# Patient Record
Sex: Female | Born: 1995 | Race: Asian | Hispanic: No | Marital: Single | Smoking: Current every day smoker
Health system: Southern US, Community
[De-identification: ages and names within clinical notes are randomized; demographics above are authoritative.]

---

## 2015-05-24 ENCOUNTER — Emergency Department (HOSPITAL_COMMUNITY): Payer: PRIVATE HEALTH INSURANCE

## 2015-05-24 ENCOUNTER — Emergency Department (HOSPITAL_COMMUNITY)
Admission: EM | Admit: 2015-05-24 | Discharge: 2015-05-26 | Disposition: A | Discharge status: Home / Self Care | Payer: PRIVATE HEALTH INSURANCE | Attending: Emergency Medicine | Admitting: Emergency Medicine

## 2015-05-24 ENCOUNTER — Encounter (HOSPITAL_COMMUNITY): Payer: Self-pay

## 2015-05-24 DIAGNOSIS — Y9289 Other specified places as the place of occurrence of the external cause: Secondary | ICD-10-CM | POA: Insufficient documentation

## 2015-05-24 DIAGNOSIS — S90812A Abrasion, left foot, initial encounter: Secondary | ICD-10-CM | POA: Insufficient documentation

## 2015-05-24 DIAGNOSIS — Z3202 Encounter for pregnancy test, result negative: Secondary | ICD-10-CM | POA: Diagnosis not present

## 2015-05-24 DIAGNOSIS — X58XXXA Exposure to other specified factors, initial encounter: Secondary | ICD-10-CM | POA: Diagnosis not present

## 2015-05-24 DIAGNOSIS — Y9389 Activity, other specified: Secondary | ICD-10-CM | POA: Diagnosis not present

## 2015-05-24 DIAGNOSIS — F29 Unspecified psychosis not due to a substance or known physiological condition: Secondary | ICD-10-CM | POA: Diagnosis not present

## 2015-05-24 DIAGNOSIS — S80212A Abrasion, left knee, initial encounter: Secondary | ICD-10-CM | POA: Insufficient documentation

## 2015-05-24 DIAGNOSIS — S80211A Abrasion, right knee, initial encounter: Secondary | ICD-10-CM | POA: Insufficient documentation

## 2015-05-24 DIAGNOSIS — F0630 Mood disorder due to known physiological condition, unspecified: Secondary | ICD-10-CM

## 2015-05-24 DIAGNOSIS — Y998 Other external cause status: Secondary | ICD-10-CM | POA: Diagnosis not present

## 2015-05-24 DIAGNOSIS — F1994 Other psychoactive substance use, unspecified with psychoactive substance-induced mood disorder: Secondary | ICD-10-CM

## 2015-05-24 DIAGNOSIS — Z720 Tobacco use: Secondary | ICD-10-CM | POA: Insufficient documentation

## 2015-05-24 DIAGNOSIS — R000 Tachycardia, unspecified: Secondary | ICD-10-CM | POA: Insufficient documentation

## 2015-05-24 DIAGNOSIS — Z008 Encounter for other general examination: Secondary | ICD-10-CM | POA: Diagnosis present

## 2015-05-24 DIAGNOSIS — S90811A Abrasion, right foot, initial encounter: Secondary | ICD-10-CM | POA: Insufficient documentation

## 2015-05-24 LAB — CBC WITH DIFFERENTIAL/PLATELET
BASOS PCT: 0 % (ref 0–1)
Basophils Absolute: 0 10*3/uL (ref 0.0–0.1)
EOS PCT: 2 % (ref 0–5)
Eosinophils Absolute: 0.3 10*3/uL (ref 0.0–0.7)
HEMATOCRIT: 41.8 % (ref 36.0–46.0)
HEMOGLOBIN: 14 g/dL (ref 12.0–15.0)
LYMPHS PCT: 48 % — AB (ref 12–46)
Lymphs Abs: 6.9 10*3/uL — ABNORMAL HIGH (ref 0.7–4.0)
MCH: 31.2 pg (ref 26.0–34.0)
MCHC: 33.5 g/dL (ref 30.0–36.0)
MCV: 93.1 fL (ref 78.0–100.0)
MONOS PCT: 5 % (ref 3–12)
Monocytes Absolute: 0.7 10*3/uL (ref 0.1–1.0)
NEUTROS ABS: 6.4 10*3/uL (ref 1.7–7.7)
NEUTROS PCT: 45 % (ref 43–77)
Platelets: 246 10*3/uL (ref 150–400)
RBC: 4.49 MIL/uL (ref 3.87–5.11)
RDW: 11.9 % (ref 11.5–15.5)
WBC: 14.3 10*3/uL — ABNORMAL HIGH (ref 4.0–10.5)

## 2015-05-24 LAB — BASIC METABOLIC PANEL
ANION GAP: 23 — AB (ref 5–15)
ANION GAP: 8 (ref 5–15)
BUN: 7 mg/dL (ref 6–20)
BUN: 5 mg/dL — ABNORMAL LOW (ref 6–20)
CALCIUM: 8.9 mg/dL (ref 8.9–10.3)
CALCIUM: 8.5 mg/dL — AB (ref 8.9–10.3)
CO2: 9 mmol/L — ABNORMAL LOW (ref 22–32)
CO2: 22 mmol/L (ref 22–32)
Chloride: 103 mmol/L (ref 101–111)
Chloride: 106 mmol/L (ref 101–111)
Creatinine, Ser: 0.77 mg/dL (ref 0.44–1.00)
Creatinine, Ser: 0.62 mg/dL (ref 0.44–1.00)
GFR calc non Af Amer: >60 mL/min (ref >60)
GLUCOSE: 141 mg/dL — AB (ref 65–99)
GLUCOSE: 100 mg/dL — AB (ref 65–99)
POTASSIUM: 3 mmol/L — AB (ref 3.5–5.1)
POTASSIUM: 3.7 mmol/L (ref 3.5–5.1)
Sodium: 135 mmol/L (ref 135–145)
Sodium: 136 mmol/L (ref 135–145)

## 2015-05-24 LAB — ETHANOL: Alcohol, Ethyl (B): 49 mg/dL — ABNORMAL HIGH (ref <5)

## 2015-05-24 LAB — RAPID URINE DRUG SCREEN, HOSP PERFORMED
Amphetamines: NOT DETECTED
Barbiturates: NOT DETECTED
Benzodiazepines: NOT DETECTED
COCAINE: NOT DETECTED
OPIATES: NOT DETECTED
TETRAHYDROCANNABINOL: NOT DETECTED

## 2015-05-24 LAB — MONONUCLEOSIS SCREEN: Mono Screen: NEGATIVE

## 2015-05-24 LAB — ACETAMINOPHEN LEVEL

## 2015-05-24 LAB — I-STAT BETA HCG BLOOD, ED (MC, WL, AP ONLY)

## 2015-05-24 LAB — SALICYLATE LEVEL: Salicylate Lvl: <4 mg/dL (ref 2.8–30.0)

## 2015-05-24 LAB — RAPID STREP SCREEN (MED CTR MEBANE ONLY): STREPTOCOCCUS, GROUP A SCREEN (DIRECT): NEGATIVE

## 2015-05-24 MED ORDER — IBUPROFEN 200 MG PO TABS: 600.0000 mg | ORAL_TABLET | Freq: Three times a day (TID) | ORAL | Status: DC | PRN

## 2015-05-24 MED ORDER — LORAZEPAM 2 MG/ML IJ SOLN
1.0000 mg | Freq: Once | INTRAMUSCULAR | Status: AC
  Administered 2015-05-24: 1 mg via INTRAVENOUS
  Filled 2015-05-24: qty 1

## 2015-05-24 MED ORDER — ONDANSETRON HCL 4 MG PO TABS: 4.0000 mg | ORAL_TABLET | Freq: Three times a day (TID) | ORAL | Status: DC | PRN

## 2015-05-24 MED ORDER — ACETAMINOPHEN 325 MG PO TABS: 650.0000 mg | ORAL_TABLET | ORAL | Status: DC | PRN

## 2015-05-24 MED ORDER — SODIUM CHLORIDE 0.9 % IV BOLUS (SEPSIS)
1000.0000 mL | Freq: Once | INTRAVENOUS | Status: AC
Start: 2015-05-24 — End: 2015-05-24
  Administered 2015-05-24: 1000 mL via INTRAVENOUS

## 2015-05-24 NOTE — ED Notes (Signed)
Pt jumped out of car - had been drinking and "smoking something" this evening. Friends took away phone. A&O x1. Pt paranoid/anxious and believes everyone is going to hurt her. [employer] at bedside.

## 2015-05-24 NOTE — ED Notes (Signed)
Pt requesting water - given cup of water and refusing to drink, stating "you poisoned the water." Pt given sealed bottle of water and still refusing to drink, stating "you poisoned me - you are trying to kill me." Explained the staff is not trying to harm pt.

## 2015-05-24 NOTE — ED Provider Notes (Signed)
CSN: [id number]     Arrival date & time 05/24/15  0406 History   First MD Initiated Contact with Patient 05/24/15 0430     Chief Complaint  Patient presents with  . Drug Problem     (Consider location/radiation/quality/duration/timing/severity/associated sxs/prior Treatment) HPI 19 year old Asian female presents to the emergency department.  Altered, scared, paranoid.  Patient reports that she has been drinking beer and smoked a brown cigarettes about 2 hours ago.  She reports that she believes that had marijuana.  She is unsure if there were other substances.  Patient reports that she feels like she is going to die, and wishes to call her mother, father and grandparents to tell them she's sorry.  Patient reports that she has a "coughing problem".  She reports that she has many medical problems, but cannot relate them to me.  She denies any medications or allergies. No past medical history on file. No past surgical history on file. No family history on file. Social History  Substance Use Topics  . Smoking status: Current Every Day Smoker  . Smokeless tobacco: Not on file  . Alcohol Use: Yes   OB History    No data available     Review of Systems  Level V caveat, altered mental status  Allergies  Review of patient's allergies indicates not on file.  Home Medications   Prior to Admission medications   Not on File   BP 125/64 mmHg  Pulse 135  Temp(Src) 98.1 F (36.7 C) (Oral)  Resp 22  SpO2 100% Physical Exam  Constitutional: She appears well-developed and well-nourished. She appears distressed.  HENT:  Head: Normocephalic and atraumatic.  Right Ear: External ear normal.  Left Ear: External ear normal.  Nose: Nose normal.  Mouth/Throat: Oropharynx is clear and moist.  Eyes: Conjunctivae and EOM are normal. Pupils are equal, round, and reactive to light.  Neck: Normal range of motion. Neck supple. No JVD present. No tracheal deviation present. No thyromegaly present.   Cardiovascular: Regular rhythm, normal heart sounds and intact distal pulses.  Exam reveals no gallop and no friction rub.   No murmur heard. tachycardia  Pulmonary/Chest: Effort normal and breath sounds normal. No stridor. No respiratory distress. She has no wheezes. She has no rales. She exhibits no tenderness.  Abdominal: Soft. Bowel sounds are normal. She exhibits no distension and no mass. There is no tenderness. There is no rebound and no guarding.  Musculoskeletal: Normal range of motion. She exhibits no edema or tenderness.  Abrasions to knees and feet  Lymphadenopathy:    She has no cervical adenopathy.  Neurological: She is alert. She displays normal reflexes. She exhibits normal muscle tone. Coordination normal.  Skin: Skin is warm and dry. No rash noted. No erythema. No pallor.  Psychiatric:  Patient is paranoid, appears frightened  Nursing note and vitals reviewed.   ED Course  Procedures (including critical care time) Labs Review Labs Reviewed  BASIC METABOLIC PANEL - Abnormal; Notable for the following:    Potassium 3.0 (*)    CO2 9 (*)    Glucose, Bld 141 (*)    Anion gap 23 (*)    All other components within normal limits  CBC WITH DIFFERENTIAL/PLATELET - Abnormal; Notable for the following:    WBC 14.3 (*)    Lymphocytes Relative 48 (*)    Lymphs Abs 6.9 (*)    All other components within normal limits  ETHANOL - Abnormal; Notable for the following:    Alcohol, Ethyl (  B) 49 (*)    All other components within normal limits  URINE RAPID DRUG SCREEN, HOSP PERFORMED  I-STAT BETA HCG BLOOD, ED ([facility], [facility], [facility] ONLY)    Imaging Review No results found. I have personally reviewed and evaluated these images and lab results as part of my medical decision-making.   EKG Interpretation   Date/Time:  Saturday May 24 2015 04:17:14 EDT Ventricular Rate:  146 PR Interval:  91 QRS Duration: 95 QT Interval:  332 QTC Calculation: 517 R Axis:   95 Text  Interpretation:  Sinus tachycardia Consider RVH w/ secondary repol  abnormality Repol abnrm suggests ischemia, diffuse leads Prolonged QT  interval Confirmed by Kathrina Crosley  MD, Emilea Goga ([id number]) on 05/24/2015 5:01:58 AM      MDM   Final diagnoses:  None    19 year old female under the influence of unknown substance with tachycardia, paranoia, anxiety.  Plan for IV fluids and Ativan.  We'll check baseline labs.  6:52 AM Initially improved HR and behavior with ativan.  Pt has required additional ativan to help with anxiety/paranoia.  At this time she is compliant with staying with [place], although very suspicious about staff.  Will hold on IVC, but will need IVC if she should want to leave prior to return to baseline.  [employer] officers have been in touch with foreign Doctor, general practice, and her identity is being verified.    Marisa Severin, MD 05/24/15 [phone]

## 2015-05-24 NOTE — ED Notes (Signed)
Brandi Lyons contacted this RN to ask for pt status, with pt permission this RN went over pt status and plan of care at bedside. Brandi Lyons, pt mentor also at bedside and with pt permission, plan of care discussed.

## 2015-05-24 NOTE — ED Notes (Signed)
When ambulating pt to restroom, pt exclaims "you have a gun!" to this RN, points at ED phone in clip, and starts backing away. Calmly explained there were no guns and just a phone. Second RN, Johny Drilling, observed incident.

## 2015-05-24 NOTE — ED Notes (Signed)
Pt states "i havent slept in 2 days im afraid now if i go to sleep i wont wake up." pt tearful at this time,  This RN encouraged pt to talk about what was going on. 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 Pt states "I started to feel like my lungs were on fire and I couldn't breathe, the people with me were trying to kill me and keep me from coming to the hospital." Pt states she tried to jump out of the car because she was scared and couldn't breathe. Pt denies any SI/HI or history of drug use. Pt tearful and states she thought these people were her friends, pt states she is afraid that she has let her parents down in [place] if they find out.  Pt request to stay on the monitor so that she can sleep and so we can see if she doesn't wake up.

## 2015-05-24 NOTE — ED Notes (Signed)
Pt asking where her phone ids located.

## 2015-05-24 NOTE — BH Assessment (Signed)
Tele Assessment Note   Brandi Lyons is an 19 y.o. female. Writer spoke w/ EDP Rancour re: pt's presentation prior to teleassessment. Pt is dressed in hospital gown. Her affect is guarded. Pt is poor historian, partly due to her poor [place]. She is also a poor historian as she doesn't answer the questions asked. She reports she has lived in [place] for 8 mos since arriving from [place]. She says she lives with "two new roommates", and she likes them. Pt is oriented to place, date, and person. She reports she is at [facility] d/t breathing issues. She says, "I just want to make sure I can breathe." Pt's speech is tangential and circumstantial. When writer asks if pt smokes a cigarette last night, pt holds up the wires attached to the hospital bed. She then says, "Put this one first". Pt asks if teleassessment will be broadcast on the internet or if writer will placed assessment in the newspaper. Writer assures pt that pt's assessment isn't being taped and no one has access to her assessment except for medical staff. Writer asks pt if she thought RN had a gun earlier today. Pt replies "yes". She says, "I just feel scared." Pt reports hospital staff is trying to kill her. Pt denies SI and HI. She denies [facility].  Romero Belling, [employer] educator with [employer], comes in and is bedside at pt's request. Dan Humphreys reports she works with Nicole Kindred through [employer]'s cultural adaptation program. Clinical research associate lets Dan Humphreys speak w/ pt for a few mins and then Clinical research associate continues teleassessment. Walker provides collateral info. She reports that pt was at a potluck last night at her apartment complex. Pt reports she was given what she thought was a cigarette by a teenager from [place]. Pt reports that after smoking the "cigarette", she felt like she couldn't breathe. She says she told the people from [place] but they were worried they would get in trouble. She says they wouldn't let her out of the car. Pt reports she jumped out of the  car window and flagged a stranger down who then drove her to the airport. Dan Humphreys says that pt is afraid she will be sent back to [place]. She says pt is afraid pt's parents will be informed. Walker reports pt is "very frightenedTour manager discussed pt with Claudette Head DNP who recommends pt be kept overnight in [facility] and discharged in am 8/21 if pt's psychosis is gone.   Axis I:  Substance Induced Psychotic Disorder Axis II: Deferred Axis III: No past medical history on file. Axis IV: other psychosocial or environmental problems and problems related to social environment Axis V: 31-40 impairment in reality testing  Past Medical History: No past medical history on file.  No past surgical history on file.  Family History: No family history on file.  Social History:  reports that she has been smoking.  She does not have any smokeless tobacco history on file. She reports that she drinks alcohol. Her drug history is not on file.  Additional Social History:  Alcohol / Drug Use Pain Medications: unable to assess Prescriptions: unable to assess Over the Counter: unable to assess History of alcohol / drug use?:  (pt sts drank "one bottle" of alcohol last night)  CIWA: CIWA-Ar BP: (!) 116/52 mmHg Pulse Rate: 95 COWS:    PATIENT STRENGTHS: (choose at least two) Average or above average intelligence Communication skills Supportive family/friends  Allergies: Allergies not on file  Home Medications:  (Not in a hospital admission)  OB/GYN Status:  No LMP recorded.  General Assessment Data Location of Assessment: [facility] ED TTS Assessment: In system Is this a Tele or Face-to-Face Assessment?: Tele Assessment Is this an Initial Assessment or a Re-assessment for this encounter?: Initial Assessment Marital status: Single Is patient pregnant?: No Living Arrangements: Non-relatives/Friends (two new roommates) Can pt return to current living arrangement?: Yes Admission Status: Voluntary Is  patient capable of signing voluntary admission?: No Referral Source: Self/Family/Friend Insurance type: none     Crisis Care Plan Living Arrangements: Non-relatives/Friends (two new roommates) Name of Psychiatrist: none Name of Therapist: none  Education Status Is patient currently in school?: Yes Name of school: [employer]  Risk to self with the past 6 months Suicidal Ideation: No Has patient been a risk to self within the past 6 months prior to admission? : No Suicidal Intent: No Has patient had any suicidal intent within the past 6 months prior to admission? : No Is patient at risk for suicide?: No Suicidal Plan?: No Has patient had any suicidal plan within the past 6 months prior to admission? : No Access to Means:  (n/a) What has been your use of drugs/alcohol within the last 12 months?: pt denies use except for last night Previous Attempts/Gestures: No How many times?: 0 Other Self Harm Risks: none Triggers for Past Attempts:  (n/a) Intentional Self Injurious Behavior:  (unable to assess) Family Suicide History: Unable to assess Recent stressful life event(s):  (unable to assess) Persecutory voices/beliefs?:  (unable to assess) Depression:  (unable to assess) Depression Symptoms:  (unable to assess) Substance abuse history and/or treatment for substance abuse?: No Suicide prevention information given to non-admitted patients: Not applicable  Risk to Others within the past 6 months Homicidal Ideation: No Does patient have any lifetime risk of violence toward others beyond the six months prior to admission? : No Thoughts of Harm to Others: No Current Homicidal Intent: No Current Homicidal Plan: No Access to Homicidal Means: No Identified Victim: none History of harm to others?: No Assessment of Violence: None Noted Violent Behavior Description: pt calm  Does patient have access to weapons?: No Criminal Charges Pending?: No Does patient have a court date: No Is  patient on probation?: No  Psychosis Hallucinations: None noted Delusions: Persecutory (thinks hospital staff trying to kill her)  Mental Status Report Appearance/Hygiene: In hospital gown, Unremarkable Eye Contact: Fair Motor Activity: Freedom of movement, Restlessness Speech: Logical/coherent, Tangential Level of Consciousness: Alert, Quiet/awake Mood:  (unable to assess) Affect:  (guarded) Thought Processes: Circumstantial, Tangential Judgement: Impaired Orientation: Person, Time Obsessive Compulsive Thoughts/Behaviors: Unable to Assess  Cognitive Functioning Concentration: Unable to Assess Memory: Unable to Assess IQ: Average Insight: Poor Impulse Control: Fair Appetite:  (unable to assess) Sleep: Unable to Assess Vegetative Symptoms: Unable to Assess  ADLScreening [facility] Assessment Services) Patient's cognitive ability adequate to safely complete daily activities?: No Patient able to express need for assistance with ADLs?: Yes Independently performs ADLs?: Yes (appropriate for developmental age)  Prior Inpatient Therapy Prior Inpatient Therapy: No Prior Therapy Dates:  na Prior Therapy Facilty/Provider(s): na Reason for Treatment: na  Prior Outpatient Therapy Prior Outpatient Therapy: No Prior Therapy Dates: na Prior Therapy Facilty/Provider(s): na Reason for Treatment: na Does patient have an ACCT team?: No Does patient have Intensive In-House Services?  : No Does patient have [facility] services? : No Does patient have P4CC services?: No  ADL Screening (condition at time of admission) Patient's cognitive ability adequate to safely complete daily activities?: No Is the patient  deaf or have difficulty hearing?: No Does the patient have difficulty seeing, even when wearing glasses/contacts?: No Does the patient have difficulty concentrating, remembering, or making decisions?: Yes Patient able to express need for assistance with ADLs?: Yes Does the patient have  difficulty dressing or bathing?: No Independently performs ADLs?: Yes (appropriate for developmental age) Does the patient have difficulty walking or climbing stairs?: No Weakness of Legs: None Weakness of Arms/Hands: None  Home Assistive Devices/Equipment Home Assistive Devices/Equipment: None    Abuse/Neglect Assessment (Assessment to be complete while patient is alone) Physical Abuse:  (unable to assess) Verbal Abuse:  (unable to assess) Sexual Abuse:  (unable to assess) Exploitation of patient/patient's resources:  (unable to assess) Self-Neglect:  (unable to assess)     Advance Directives (For Healthcare) Does patient have an advance directive?: No Would patient like information on creating an advanced directive?: No - patient declined information    Additional Information 1:1 In Past 12 Months?: No CIRT Risk: No Elopement Risk: No Does patient have medical clearance?: Yes     Disposition:  Disposition Initial Assessment Completed for this Encounter: Yes Disposition of Patient: Other dispositions Other disposition(s): Other (Comment) (conrad withrow DNP recommends pt stay overnight and be disch)  Sharine Cadle P 05/24/2015 10:18 AM

## 2015-05-24 NOTE — ED Notes (Signed)
Belongings x 1 labeled belongings bag placed at nurses' desk for inventory. Pt's friend's silver cup placed in bag that was left in other room.

## 2015-05-24 NOTE — ED Notes (Signed)
Meal tray ordered for pt  

## 2015-05-24 NOTE — ED Notes (Signed)
Pt not able to give any history at this time

## 2015-05-24 NOTE — ED Provider Notes (Signed)
On recheck, anion gap has resolved. Tachycardia has resolved. Salicylate level negative. Patient is oriented 2. She remains paranoid and has suspicious behavior. TTS consult placed to recommends continued observation in the ED.  CT head is negative. Psychiatry recommends observing patient until tomorrow for resolution of her psychosis. Patient is calm and cooperative. IVC paperwork was completed. Patient is mildly confused but oriented 2. She denies any intentional drug ingestions.  Glynn Octave, MD 05/24/15 [phone]

## 2015-05-24 NOTE — ED Notes (Signed)
Romero Belling, educator at [facility] - contact if needed at [phone]

## 2015-05-25 DIAGNOSIS — F0630 Mood disorder due to known physiological condition, unspecified: Secondary | ICD-10-CM

## 2015-05-25 DIAGNOSIS — F1994 Other psychoactive substance use, unspecified with psychoactive substance-induced mood disorder: Secondary | ICD-10-CM

## 2015-05-25 LAB — COMPREHENSIVE METABOLIC PANEL
ALT: 16 U/L (ref 14–54)
AST: 21 U/L (ref 15–41)
Albumin: 3.7 g/dL (ref 3.5–5.0)
Alkaline Phosphatase: 54 U/L (ref 38–126)
Anion gap: 8 (ref 5–15)
BILIRUBIN TOTAL: 0.5 mg/dL (ref 0.3–1.2)
BUN: 11 mg/dL (ref 6–20)
CHLORIDE: 105 mmol/L (ref 101–111)
CO2: 25 mmol/L (ref 22–32)
CREATININE: 0.76 mg/dL (ref 0.44–1.00)
Calcium: 9.2 mg/dL (ref 8.9–10.3)
Glucose, Bld: 100 mg/dL — ABNORMAL HIGH (ref 65–99)
POTASSIUM: 3.9 mmol/L (ref 3.5–5.1)
Sodium: 138 mmol/L (ref 135–145)
TOTAL PROTEIN: 5.9 g/dL — AB (ref 6.5–8.1)

## 2015-05-25 NOTE — ED Provider Notes (Signed)
  Physical Exam  BP 98/60 mmHg  Pulse 84  Temp(Src) 98 F (36.7 C) (Oral)  Resp 14  SpO2 98%  LMP 05/17/2015  Physical Exam  ED Course  Procedures  MDM Patient seen by TTS this AM. Still a little paranoid. Not back to baseline. TTS felt likely substance induced. Will observe in the ED today and reassess by psych tomorrow.   Richardean Canal, MD 05/25/15 1136

## 2015-05-25 NOTE — ED Notes (Signed)
Pt calling uncle in [place] at this time.

## 2015-05-25 NOTE — ED Notes (Signed)
Pt on phone with Lindie Spruce , advisor

## 2015-05-25 NOTE — ED Notes (Signed)
Pharmacy Tech in w/pt. 

## 2015-05-25 NOTE — ED Notes (Signed)
Telepsych being performed by Conrad, NP, [facility]. 

## 2015-05-25 NOTE — Consult Note (Signed)
Telepsych Consultation   Reason for Consult:  Psychosis, paranoid ideation Referring Physician:  EDP Patient Identification: Brandi Lyons MRN:  [id number] Principal Diagnosis: Psychoactive substance-induced mood disorder Diagnosis:   Patient Active Problem List   Diagnosis Date Noted  . Psychoactive substance-induced mood disorder [F19.94, F06.30] 05/25/2015    Priority: High    Total Time spent with patient: 25 minutes  Subjective:   Brandi Lyons is a 19 y.o. female patient admitted with reports of bizarre behavior with paranoid ideation. Pt seen and chart reviewed. Pt is alert/oriented x4, although continues to be somewhat paranoid and fearful. Pt thought the nurse had a gun last night when it was only a phone. Today, during assessment, pt reported that she felt her friends tried to kill her after she smoked what she thought was a cigarette. Pt reports that she had 2-3 beers and smoked the substance, but admits that it may have been some type of drug. Pt denies suicidal/homicidal ideation and psychosis, but was very fearful when I asked about calling her friends. She said "don't call them, they tried to kill me!".   This provider spoke to pt's friend on the chart, Jolyn Lent, who reports that pt "smoked something like weed, not sure what it was" and then pt began acting paranoid and jumped out of the car window. Jolyn Lent reports she is very concerned about the pt and that she will come visit her to see if she is at her known baseline this afternoon.   HPI: I have reviewed and concur with HPI below, modified as follows:   Brandi Lyons is an 19 y.o. female. Writer spoke w/ EDP Rancour re: pt's presentation prior to teleassessment. Pt is dressed in hospital gown. Her affect is guarded. Pt is poor historian, partly due to her poor [place]. She is also a poor historian as she doesn't answer the questions asked. She reports she has lived in [place] for 8 mos since arriving from [place]. She says she lives  with "two new roommates", and she likes them. Pt is oriented to place, date, and person. She reports she is at [facility] d/t breathing issues. She says, "I just want to make sure I can breathe." Pt's speech is tangential and circumstantial. When writer asks if pt smokes a cigarette last night, pt holds up the wires attached to the hospital bed. She then says, "Put this one first". Pt asks if teleassessment will be broadcast on the internet or if writer will placed assessment in the newspaper. Writer assures pt that pt's assessment isn't being taped and no one has access to her assessment except for medical staff. Writer asks pt if she thought RN had a gun earlier today. Pt replies "yes". She says, "I just feel scared." Pt reports hospital staff is trying to kill her. Pt denies SI and HI. She denies [facility].  Antionette Poles, [employer] educator with [employer], comes in and is bedside at pt's request. Gilford Rile reports she works with Sonny Masters through [employer]. Probation officer lets Gilford Rile speak w/ pt for a few mins and then Probation officer continues teleassessment. Walker provides collateral info. She reports that pt was at a potluck last night at her apartment complex. Pt reports she was given what she thought was a cigarette by a teenager from [place]. Pt reports that after smoking the "cigarette", she felt like she couldn't breathe. She says she told the people from [place] but they were worried they would get in trouble. She says they wouldn't  let her out of the car. Pt reports she jumped out of the car window and flagged a stranger down who then drove her to the airport. Gilford Rile says that pt is afraid she will be sent back to [place]. She says pt is afraid pt's parents will be informed. Walker reports pt is "very frightenedChartered certified accountant discussed pt with Catalina Pizza DNP who recommends pt be kept overnight in [facility] and discharged in am 8/21 if pt's psychosis is gone.   Pt spent the night in the ED,  psychotic features are persistent per nursing staff. Telepsychiatry consult ordered and performed.   HPI Elements:   Location:  Psychiatric. Quality:  Improving, but still concerning. Severity:  Severe. Timing:  Transient. Duration:  Acute onset. Context:  Pt smoked unknown substance, began acting strange, still persisting.  Past Medical History: No past medical history on file. No past surgical history on file. Family History: No family history on file. Social History:  History  Alcohol Use  . Yes     History  Drug Use Not on file    Social History   Social History  . Marital Status: Single    Spouse Name: N/A  . Number of Children: N/A  . Years of Education: N/A   Social History Main Topics  . Smoking status: Current Every Day Smoker  . Smokeless tobacco: Not on file  . Alcohol Use: Yes  . Drug Use: Not on file  . Sexual Activity: Not on file   Other Topics Concern  . Not on file   Social History Narrative  . No narrative on file   Additional Social History:    Pain Medications: unable to assess Prescriptions: unable to assess Over the Counter: unable to assess History of alcohol / drug use?:  (pt sts drank "one bottle" of alcohol last night)                     Allergies:  Allergies not on file  Labs:  Results for orders placed or performed during the hospital encounter of 05/24/15 (from the past 48 hour(s))  Basic metabolic panel     Status: Abnormal   Collection Time: 05/24/15  4:20 AM  Result Value Ref Range   Sodium 135 135 - 145 mmol/L   Potassium 3.0 (L) 3.5 - 5.1 mmol/L   Chloride 103 101 - 111 mmol/L   CO2 9 (L) 22 - 32 mmol/L   Glucose, Bld 141 (H) 65 - 99 mg/dL   BUN 7 6 - 20 mg/dL   Creatinine, Ser 0.77 0.44 - 1.00 mg/dL   Calcium 8.9 8.9 - 10.3 mg/dL   GFR calc non Af Amer >60 >60 mL/min   GFR calc Af Amer >60 >60 mL/min    Comment: (NOTE) The eGFR has been calculated using the CKD EPI equation. This calculation has not been  validated in all clinical situations. eGFR's persistently <60 mL/min signify possible Chronic Kidney Disease.    Anion gap 23 (H) 5 - 15  CBC with Differential     Status: Abnormal   Collection Time: 05/24/15  4:20 AM  Result Value Ref Range   WBC 14.3 (H) 4.0 - 10.5 K/uL   RBC 4.49 3.87 - 5.11 MIL/uL   Hemoglobin 14.0 12.0 - 15.0 g/dL   HCT 41.8 36.0 - 46.0 %   MCV 93.1 78.0 - 100.0 fL   MCH 31.2 26.0 - 34.0 pg   MCHC 33.5 30.0 - 36.0 g/dL  RDW 11.9 11.5 - 15.5 %   Platelets 246 150 - 400 K/uL   Neutrophils Relative % 45 43 - 77 %   Lymphocytes Relative 48 (H) 12 - 46 %   Monocytes Relative 5 3 - 12 %   Eosinophils Relative 2 0 - 5 %   Basophils Relative 0 0 - 1 %   Neutro Abs 6.4 1.7 - 7.7 K/uL   Lymphs Abs 6.9 (H) 0.7 - 4.0 K/uL   Monocytes Absolute 0.7 0.1 - 1.0 K/uL   Eosinophils Absolute 0.3 0.0 - 0.7 K/uL   Basophils Absolute 0.0 0.0 - 0.1 K/uL   RBC Morphology ELLIPTOCYTES     Comment: BURR CELLS   WBC Morphology ATYPICAL LYMPHOCYTES   Ethanol     Status: Abnormal   Collection Time: 05/24/15  4:20 AM  Result Value Ref Range   Alcohol, Ethyl (B) 49 (H) <5 mg/dL    Comment:        LOWEST DETECTABLE LIMIT FOR SERUM ALCOHOL IS 5 mg/dL FOR MEDICAL PURPOSES ONLY   I-Stat beta hCG blood, ED     Status: None   Collection Time: 05/24/15  4:57 AM  Result Value Ref Range   I-stat hCG, quantitative <5.0 <5 mIU/mL   Comment 3            Comment:   GEST. AGE      CONC.  (mIU/mL)   <=1 WEEK        5 - 50     2 WEEKS       50 - 500     3 WEEKS       100 - 10,000     4 WEEKS     1,000 - 30,000        FEMALE AND NON-PREGNANT FEMALE:     LESS THAN 5 mIU/mL   Urine rapid drug screen (hosp performed)     Status: None   Collection Time: 05/24/15  7:07 AM  Result Value Ref Range   Opiates NONE DETECTED NONE DETECTED   Cocaine NONE DETECTED NONE DETECTED   Benzodiazepines NONE DETECTED NONE DETECTED   Amphetamines NONE DETECTED NONE DETECTED   Tetrahydrocannabinol NONE  DETECTED NONE DETECTED   Barbiturates NONE DETECTED NONE DETECTED    Comment:        DRUG SCREEN FOR MEDICAL PURPOSES ONLY.  IF CONFIRMATION IS NEEDED FOR ANY PURPOSE, NOTIFY LAB WITHIN 5 DAYS.        LOWEST DETECTABLE LIMITS FOR URINE DRUG SCREEN Drug Class       Cutoff (ng/mL) Amphetamine      1000 Barbiturate      200 Benzodiazepine   563 Tricyclics       149 Opiates          300 Cocaine          300 THC              50   Basic metabolic panel     Status: Abnormal   Collection Time: 05/24/15  7:51 AM  Result Value Ref Range   Sodium 136 135 - 145 mmol/L   Potassium 3.7 3.5 - 5.1 mmol/L    Comment: DELTA CHECK NOTED   Chloride 106 101 - 111 mmol/L   CO2 22 22 - 32 mmol/L   Glucose, Bld 100 (H) 65 - 99 mg/dL   BUN 5 (L) 6 - 20 mg/dL   Creatinine, Ser 0.62 0.44 - 1.00 mg/dL   Calcium 8.5 (  L) 8.9 - 10.3 mg/dL   GFR calc non Af Amer >60 >60 mL/min   GFR calc Af Amer >60 >60 mL/min    Comment: (NOTE) The eGFR has been calculated using the CKD EPI equation. This calculation has not been validated in all clinical situations. eGFR's persistently <60 mL/min signify possible Chronic Kidney Disease.    Anion gap 8 5 - 15  Salicylate level     Status: None   Collection Time: 05/24/15  7:51 AM  Result Value Ref Range   Salicylate Lvl <1.0 2.8 - 30.0 mg/dL  Acetaminophen level     Status: Abnormal   Collection Time: 05/24/15  7:51 AM  Result Value Ref Range   Acetaminophen (Tylenol), Serum <10 (L) 10 - 30 ug/mL    Comment:        THERAPEUTIC CONCENTRATIONS VARY SIGNIFICANTLY. A RANGE OF 10-30 ug/mL MAY BE AN EFFECTIVE CONCENTRATION FOR MANY PATIENTS. HOWEVER, SOME ARE BEST TREATED AT CONCENTRATIONS OUTSIDE THIS RANGE. ACETAMINOPHEN CONCENTRATIONS >150 ug/mL AT 4 HOURS AFTER INGESTION AND >50 ug/mL AT 12 HOURS AFTER INGESTION ARE OFTEN ASSOCIATED WITH TOXIC REACTIONS.   Mononucleosis screen     Status: None   Collection Time: 05/24/15 11:32 AM  Result Value Ref  Range   Mono Screen NEGATIVE NEGATIVE  Rapid strep screen (not at [facility])     Status: None   Collection Time: 05/24/15  3:20 PM  Result Value Ref Range   Streptococcus, Group A Screen (Direct) NEGATIVE NEGATIVE    Comment: (NOTE) A Rapid Antigen test may result negative if the antigen level in the sample is below the detection level of this test. The FDA has not cleared this test as a stand-alone test therefore the rapid antigen negative result has reflexed to a Group A Strep culture.     Vitals: Blood pressure 98/60, pulse 84, temperature 98 F (36.7 C), temperature source Oral, resp. rate 14, last menstrual period 05/17/2015, SpO2 98 %.  Risk to Self: Suicidal Ideation: No Suicidal Intent: No Is patient at risk for suicide?: No Suicidal Plan?: No Access to Means:  (n/a) What has been your use of drugs/alcohol within the last 12 months?: pt denies use except for last night How many times?: 0 Other Self Harm Risks: none Triggers for Past Attempts:  (n/a) Intentional Self Injurious Behavior:  (unable to assess) Risk to Others: Homicidal Ideation: No Thoughts of Harm to Others: No Current Homicidal Intent: No Current Homicidal Plan: No Access to Homicidal Means: No Identified Victim: none History of harm to others?: No Assessment of Violence: None Noted Violent Behavior Description: pt calm  Does patient have access to weapons?: No Criminal Charges Pending?: No Does patient have a court date: No Prior Inpatient Therapy: Prior Inpatient Therapy: No Prior Therapy Dates:  na Prior Therapy Facilty/Provider(s): na Reason for Treatment: na Prior Outpatient Therapy: Prior Outpatient Therapy: No Prior Therapy Dates: na Prior Therapy Facilty/Provider(s): na Reason for Treatment: na Does patient have an ACCT team?: No Does patient have Intensive In-House Services?  : No Does patient have [facility] services? : No Does patient have P4CC services?: No  Current Facility-Administered  Medications  Medication Dose Route Frequency Provider Last Rate Last Dose  . acetaminophen (TYLENOL) tablet 650 mg  650 mg Oral Q4H PRN Ezequiel Essex, MD      . ibuprofen (ADVIL,MOTRIN) tablet 600 mg  600 mg Oral Q8H PRN Ezequiel Essex, MD      . ondansetron [facility]) tablet 4 mg  4 mg Oral  Q8H PRN Ezequiel Essex, MD       No current outpatient prescriptions on file.    Musculoskeletal: UTO, camera  Psychiatric Specialty Exam: Physical Exam  Review of Systems  Psychiatric/Behavioral: Positive for hallucinations (paranoid ideation) and substance abuse (ETOH, and unknown substance was smoked last night.). Negative for depression and suicidal ideas. The patient is nervous/anxious. The patient does not have insomnia.   All other systems reviewed and are negative.   Blood pressure 98/60, pulse 84, temperature 98 F (36.7 C), temperature source Oral, resp. rate 14, last menstrual period 05/17/2015, SpO2 98 %.There is no height or weight on file to calculate BMI.  General Appearance: Casual and Fairly Groomed  Engineer, water::  Good  Speech:  Clear and Coherent and Normal Rate  Volume:  Normal  Mood:  Euthymic  Affect:  Appropriate and Congruent  Thought Process:  Coherent and Goal Directed  Orientation:  Full (Time, Place, and Person)  Thought Content:  WDL  Suicidal Thoughts:  No  Homicidal Thoughts:  No  Memory:  Immediate;   Fair Recent;   Fair Remote;   Fair  Judgement:  Fair  Insight:  Fair  Psychomotor Activity:  Normal  Concentration:  Fair  Recall:  [employer] [place]  Language: Fair  Akathisia:  No  Handed:    AIMS (if indicated):     Assets:  Communication Skills Desire for Improvement Resilience Social Support  ADL's:  Intact  Cognition: WNL  Sleep:      Medical Decision Making: New problem, with additional work up planned, Review of Psycho-Social Stressors (1), Review or order clinical lab tests (1), Review of Medication Regimen & Side Effects (2)  and Review of New Medication or Change in Dosage (2)  Treatment Plan Summary: Daily contact with patient to assess and evaluate symptoms and progress in treatment and Medication management  Plan:  See below  Disposition:  -Hold overnight for further observation due to persistent (although mildly improved) bizarre behavior and paranoid ideation.    Benjamine Mola, FNP-BC 05/25/2015 9:45 AM

## 2015-05-25 NOTE — ED Notes (Signed)
Unable to get pt temp due to pt eating at this time 

## 2015-05-25 NOTE — ED Notes (Addendum)
Spoke w/[name] [phone] - who advised she is on her way here. Aware pt is not ready for d/c at this time, per Renata Caprice, NP, [facility].

## 2015-05-25 NOTE — Progress Notes (Addendum)
Chaplain on-call was approached in ED waiting room by patient friend, Margarito Courser (from [place]). This chaplain knew Margarito Courser from a language school in [place] (not the same school as the patient's school, [employer], but a similar language school).  Gessica transferred to [facility] [facility] [employer] and that is how she knows the patient.  Margarito Courser and another student, both visitors, were anxious to know how patient was doing. Chaplain explained that any information needs to come from patient.  Chaplain provided refreshments to visitors and offered services.  Please contact if support for patient or visitors is needed.   Theodoro Parma [phone]  A Chinese friend who has been visiting with the patient Juliann Mule, pronounce "[place]" - also goes by the American nickname "KiKi") expressed concern that the patient may want a fluent [place] speaker to be with her.  She is worried that the patient may not ASK for an interpreter though.  She wondered if the hospital could contact her Juliann Mule) with updates.  Chaplain explained that the nurse will speak with the patient to determine patient's preferences and that interpreters are available via telephone. Qi Qi's phone number is [phone]. The [employer] advisor from [employer], who is present at this time, stated that she doesn't think that interpreters have been used.  In addition, this friend just checked with patient asking, Has anyone spoken to you in [place] or have they talked to you about translation? And the patient stated no.  Even though the patient may speak excellent English, she may benefit from the opportunity to have a Chinese-speaking friend with her or to speak through a hospital interpreter especially given the range of emotions she may be experiencing and the fact that she is a foreign national.  Chaplain advised both friends to speak to RN and indicated that these concerns would be noted in patient's chart.    Additional notes:    Randel Books does not, in this  chaplain's opinion, speak very strong [place], but it is likely that the presence of someone from the patient's country and a native speaker of her language would be a very positive, emotional support for the patient.    Randel Books stated that she has the same dialect as the patient which at first she stated was "the basic [place]". When chaplain asked if that was, for example, Mandarin or Cantonese, Randel Books stated "Mandarin".

## 2015-05-25 NOTE — ED Notes (Signed)
Pt's friend, Shanda Bumps, visited w/pt for 20 minutes as per Renata Caprice, NP, [facility], request. She advised pt appears to be somewhat paranoid to her. States other than that, she is acting normal. States pt lives w/roommates and one is female. States she asked pt if she wanted him to come visit. Pt had advised her no. Friend advises pt had left the apartment where they all were w/other people and when she returned, she was experiencing the panic attack. Friend advised pt's abrasions to knees, legs and feet occurred when she fell d/t running around when she was upset.

## 2015-05-25 NOTE — ED Notes (Signed)
Pt asked to speak w/RN. Pt states she is remembering what occurred on 05/23/15 evening. States she is worried that someone may have seen her and put pictures on social media. Encouraged pt to vent her concerns. Pt also states she is concerned as to what will happen w/her school - [employer]. States is feeling some better. Pt appears to be more lucid. Pt continues to appear paranoid. Pt's friends, Aundra Millet, Advisor, and Chaplain have arrived to visit.

## 2015-05-26 DIAGNOSIS — F1994 Other psychoactive substance use, unspecified with psychoactive substance-induced mood disorder: Secondary | ICD-10-CM | POA: Insufficient documentation

## 2015-05-26 LAB — CULTURE, GROUP A STREP: STREP A CULTURE: NEGATIVE

## 2015-05-26 NOTE — [facility] Counselor (Signed)
[facility] Assessment Progress Note  Counselor reassessed pt this morning . Pt presented well rested, calm, and happy. She reported feeling "really good". She denies any SI/HI/AVH. She reports that she is ready to "go back to school to study" b/c "that's what I came her for, and I don't want to let my parents down". Counselor asked pt if she feels safe to leave or does she thinks anyone make be after her. Pt indicated that she feels safe and said that the "hospital helped me" and offered counselor words of gratitude for the help she received from the hospital. Pt also reported that she felt fine to go back to live with her roommates and shared that "they are worried about me".   Johny Shock. Ladona Ridgel, MS, NCC, LPCA Counselor

## 2015-05-26 NOTE — ED Provider Notes (Signed)
Filed Vitals:   05/26/15 0630  BP: 96/46  Pulse: 62  Temp: 98 F (36.7 C)  Resp: 14   Patient reevaluated by TTS this morning. She is back to baseline without any psychosis. He felt obese likely substance-induced secondary to something that she smokes. Physical examination reveals patient in no distress with normal abdominal exam, clear lungs, normal cardiac exam, normal neuro. Has some abrasions on feet from a recent fall no other e/o trauma.   Stable for dc to advisors care. Clinically sober.   Marily Memos, MD 05/26/15 1019
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
## 2015-05-27 LAB — PATHOLOGIST SMEAR REVIEW
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
